# Patient Record
Sex: Male | Born: 1965 | Hispanic: No | Marital: Married | Smoking: Never smoker
Health system: Southern US, Community
[De-identification: ages and names within clinical notes are randomized; demographics above are authoritative.]

---

## 2001-05-16 ENCOUNTER — Inpatient Hospital Stay (HOSPITAL_COMMUNITY): Admission: RE | Admit: 2001-05-16 | Discharge: 2001-05-17 | Payer: Self-pay | Admitting: Otolaryngology

## 2002-04-22 ENCOUNTER — Encounter: Admission: RE | Admit: 2002-04-22 | Discharge: 2002-04-22 | Payer: Self-pay | Admitting: Family Medicine

## 2002-04-23 ENCOUNTER — Ambulatory Visit (HOSPITAL_COMMUNITY): Admission: RE | Admit: 2002-04-23 | Discharge: 2002-04-23 | Payer: Self-pay | Admitting: Family Medicine

## 2002-12-23 ENCOUNTER — Encounter: Admission: RE | Admit: 2002-12-23 | Discharge: 2002-12-23 | Payer: Self-pay | Admitting: Otolaryngology

## 2004-01-22 ENCOUNTER — Encounter: Admission: RE | Admit: 2004-01-22 | Discharge: 2004-01-22 | Payer: Self-pay | Admitting: Otolaryngology

## 2005-08-08 ENCOUNTER — Encounter: Admission: RE | Admit: 2005-08-08 | Discharge: 2005-08-08 | Payer: Self-pay | Admitting: Otolaryngology

## 2014-07-12 ENCOUNTER — Ambulatory Visit (INDEPENDENT_AMBULATORY_CARE_PROVIDER_SITE_OTHER): Payer: Self-pay | Admitting: Family Medicine

## 2014-07-12 ENCOUNTER — Ambulatory Visit (INDEPENDENT_AMBULATORY_CARE_PROVIDER_SITE_OTHER): Payer: Self-pay

## 2014-07-12 VITALS — BP 128/78 | HR 74 | Temp 97.4°F | Resp 17 | Ht 69.0 in | Wt 237.8 lb

## 2014-07-12 DIAGNOSIS — W19XXXA Unspecified fall, initial encounter: Secondary | ICD-10-CM

## 2014-07-12 DIAGNOSIS — M25511 Pain in right shoulder: Secondary | ICD-10-CM

## 2014-07-12 DIAGNOSIS — S40011A Contusion of right shoulder, initial encounter: Secondary | ICD-10-CM

## 2014-07-12 DIAGNOSIS — M47812 Spondylosis without myelopathy or radiculopathy, cervical region: Secondary | ICD-10-CM

## 2014-07-12 DIAGNOSIS — M542 Cervicalgia: Secondary | ICD-10-CM

## 2014-07-12 DIAGNOSIS — M4692 Unspecified inflammatory spondylopathy, cervical region: Secondary | ICD-10-CM

## 2014-07-12 MED ORDER — CYCLOBENZAPRINE HCL 5 MG PO TABS: ORAL_TABLET | ORAL | Status: AC

## 2014-07-12 MED ORDER — DICLOFENAC SODIUM 75 MG PO TBEC: 75.0000 mg | DELAYED_RELEASE_TABLET | Freq: Two times a day (BID) | ORAL | Status: AC

## 2014-07-12 NOTE — Progress Notes (Signed)
  Subjective:  Patient ID: Kent Lopez, male    DOB: 03/04/1965  Age: 49 y.o. MRN: [id number]  49 year old man who has a history of having a lot of pain in his neck and upper back. When he was in [place] about a month ago he received an injection and some medication for this. He's not sure what those medicines were. About a week ago yesterday the patient fell at work. He fell on his right side and injured his right shoulder and his neck got worse. He has decreased range of motion of the right arm. He felt a pop when he fell. He is not on any other regular medications. He is generally healthy man.   Objective:   No acute distress. Neck has fairly good range of motion. The neck is tender in the C-spine and paraspinous muscles down to between the scapula. His right shoulder is also tender in the shoulder girdle. There is no visible swelling. He has limited range of motion, unable to fully abduct the arm to above his head. He has 2+ pitting edema of the left ankle, with some swelling above his knee. That is an old swelling problem he has had.  [facility] reading (PRIMARY) by  Dr. Alwyn RenHopper Arthritis lower cervical area Normal shoulder.    Assessment & Plan:   Assessment: Arthritis cervical spine  Cervical pain  Shoulder strain  Plan: There are no Patient Instructions on file for this visit.   Kent Buttery, MD 07/12/2014

## 2014-07-12 NOTE — Patient Instructions (Signed)
Apply heat to neck or shoulder  Take the cyclobenzaprine 5 mg 1 or 2 pills at bedtime if needed for muscle relaxation of neck  Take the diclofenac one twice daily as needed for pain and inflammation of shoulder and neck  Do the shoulder stretching exercises as discussed  Return at anytime if worse or not improving

## 2014-07-22 ENCOUNTER — Ambulatory Visit (INDEPENDENT_AMBULATORY_CARE_PROVIDER_SITE_OTHER): Payer: Self-pay | Admitting: Family Medicine

## 2014-07-22 ENCOUNTER — Ambulatory Visit (INDEPENDENT_AMBULATORY_CARE_PROVIDER_SITE_OTHER): Payer: Self-pay

## 2014-07-22 VITALS — BP 124/78 | HR 55 | Temp 99.8°F | Resp 17 | Ht 69.5 in | Wt 237.0 lb

## 2014-07-22 DIAGNOSIS — K5900 Constipation, unspecified: Secondary | ICD-10-CM

## 2014-07-22 DIAGNOSIS — M545 Low back pain, unspecified: Secondary | ICD-10-CM

## 2014-07-22 MED ORDER — MELOXICAM 15 MG PO TABS: 7.5000 mg | ORAL_TABLET | Freq: Every day | ORAL | Status: AC

## 2014-07-22 MED ORDER — DOCUSATE SODIUM 50 MG PO CAPS: 50.0000 mg | ORAL_CAPSULE | Freq: Two times a day (BID) | ORAL | Status: AC

## 2014-07-22 NOTE — Patient Instructions (Addendum)
[phone] Please call Julieanne Cotton, Office Manager to set up an appointment Office Hours: Weekdays  [address] [place], [place] [address] (Inside the [facility])  Dolor de espalda en el adulto (Back Pain, Adult)  El dolor de cintura es frecuente. Aproximadamente 1 de cada 5 personas lo sufren.La causa rara vez pone en peligro la vida. Con frecuencia mejora luego de algn tiempo.Alrededor de la mitad de las personas que sufren un inicio sbito de dolor de cintura, se sentirn mejor luego de 2 semanas. Aproximadamente 8 de cada 10 se sentirn mejor luego de 6 semanas.  CAUSAS  Algunas causas comunes son:   Distensin de los msculos o ligamentos que sostienen la columna vertebral.  Desgaste (degeneracin) de los discos vertebrales.  Artritis.  Traumatismos directos en la espalda. DIAGNSTICO  La mayor parte de las veces, la causa directa no se conoce.Sin embargo, Chief Technology Officer puede tratarse efectivamente an cuando no se Best boy.Una de las formas ms precisas de asegurar que la causa del dolor no constituye un peligro es responder a las preguntas del mdico acerca de su salud y sus sntomas. Si el mdico necesita ms informacin, podr indicar anlisis de laboratorio o Education officer, environmental un diagnstico por imgenes (radiografas o Health visitor).Sin embargo, aunque las [employer] modificaciones, generalmente no es necesaria la [place].  INSTRUCCIONES PARA EL CUIDADO EN EL HOGAR  En algunas personas, el dolor de espalda vuelve.Como rara vez es peligroso, los pacientes pueden aprender a Interior and spatial designer.   Mantngase activo. Si permanece sentado o de pie mucho tiempo en el mismo lugar, se tensiona la espalda.  No se siente, maneje ni se quede parado en un mismo lugar por ms de 30 minutos. Realice caminatas cortas en superficies planas ni bien el dolor haya cedido. Trate de Copy tiempo que camina .  No se quede en la cama.Si hace reposo  durante ms de 1 o 2 das, puede Estate agent.  No evite los ejercicios ni el trabajo.El cuerpo est hecho para moverse.No es peligroso estar [place], aunque le duela la espalda.La espalda se curar ms rpido si contina sus actividades antes de que el dolor se vaya.  Preste atencin a su cuerpo cuando se incline y se levante. Muchas personas sienten menos molestias cuando levantan objetos si doblan las rodillas, mantienen la carga cerca del cuerpo y evitan torcerse. Generalmente, las posiciones ms cmodas son las que ejercen menos tensin en la espalda en recuperacin.  Encuentre una posicin cmoda para dormir. Utilice un colchn firme y recustese de [place]. Doble ligeramente sus rodillas. Si se recuesta sobre su espalda, coloque una almohada debajo de sus rodillas.  Tome slo medicamentos de venta libre o recetados, segn las indicaciones del mdico. Los medicamentos de venta libre para Primary school teacher y reducir Futures trader, son los que en general ms ayudan.El mdico podr prescribirle relajantes musculares.Estos medicamentos calman el dolor de modo que pueda retornar a sus actividades normales y a Investment banker, operational.  Aplique hielo sobre la zona lesionada.  Ponga el hielo en una bolsa plstica.  Colquese una toalla entre la piel y la bolsa de hielo.  Deje la bolsa de hielo durante 15 a 20 minutos 3 a 4 veces por da, durante los primeros 2  3 das. Luego podr alternar Eusebio Me calor y hielo para reducir Chief Technology Officer y los espasmos.  Consulte a su mdico si puede tratar de hacer ejercicios para la espalda y recibir un masaje suave. Pueden ser beneficiosos.  Evite sentirse ansioso o estresado.El estrs aumenta la tensin muscular y puede empeorar el dolor de espalda.Es importante reconocer cuando est ansioso o estresado y aprender la forma de controlarlos.El ejercicio es una gran opcin. SOLICITE ATENCIN MDICA SI:   Siente un dolor que no se alivia con  reposo o medicamentos.  El dolor no mejora en 1 semana.  Desarrolla nuevos sntomas.  No se siente bien en general. SOLICITE ATENCIN MDICA DE INMEDIATO SI:  Siente un dolor que se irradia desde la espalda hacia sus piernas.  Desarrolla nuevos problemas en el intestino o la vejiga.  Siente debilidad o adormecimiento inusual en sus brazos o piernas.  Presenta nuseas o vmitos.  Presenta dolor abdominal.  Se siente desfalleciente. Document Released: 01/24/2005 Document Revised: 07/26/2011 [facility] Patient Information 2015 [place], [place]. This information is not intended to replace advice given to you by your health care provider. Make sure you discuss any questions you have with your health care provider.    Estreimiento (Constipation) Estreimiento significa que una persona tiene menos de tres evacuaciones en una semana, dificultad para defecar, o que las heces son secas, duras, o ms grandes que lo normal. A medida que envejecemos el estreimiento es ms comn. Si intenta curar el estreimiento con medicamentos que producen la evacuacin de las heces (laxantes), el problema puede empeorar. El uso prolongado de laxantes puede hacer que los msculos del colon se debiliten. Una dieta baja en fibra, no tomar suficientes lquidos y el uso de ciertos medicamentos pueden Scientist, research (life sciences).  CAUSAS   Ciertos medicamentos, como los antidepresivos, analgsicos, suplementos de hierro, anticidos y diurticos.  Algunas enfermedades, como la diabetes, el sndrome del colon irritable, enfermedad de la tiroides, o depresin.  No beber suficiente agua.  No consumir suficientes alimentos ricos en fibra.  Situaciones de estrs o viajes.  Falta de actividad fsica o de ejercicio.  Ignorar la necesidad sbita de Advertising copywriter.  Uso en exceso de laxantes. SIGNOS Y SNTOMAS   Defecar menos de tres veces por semana.  Dificultad para defecar.  Tener las heces secas y duras, o ms  grandes que las normales.  Sensacin de estar lleno o hinchado.  Dolor en la parte baja del abdomen.  No sentir alivio despus de defecar. DIAGNSTICO  El mdico le har una historia clnica y un examen fsico. Pueden hacerle exmenes adicionales para el estreimiento grave. Estos estudios pueden ser:  Un radiografa con enema de bario para examinar el recto, el colon y, en algunos casos, el intestino delgado.  Una sigmoidoscopia para examinar el colon inferior.  Una colonoscopia para examinar todo el colon. TRATAMIENTO  El tratamiento depender de la gravedad del estreimiento y de la causa. Algunos tratamientos nutricionales son beber ms lquidos y comer ms alimentos ricos en fibra. El cambio en el estilo de vida incluye hacer ejercicios de [place] regular. Si estas recomendaciones para Public relations account executive dieta y en el estilo de vida no ayudan, el mdico le puede indicar el uso de laxantes de venta libre para ayudarlo a Advertising copywriter. Los medicamentos recetados se pueden prescribir si los medicamentos de venta libre no lo [place].  INSTRUCCIONES PARA EL CUIDADO EN EL HOGAR   Consuma alimentos con alto contenido de [place], como frutas, vegetales, cereales integrales y porotos.  Limite los alimentos procesados ricos en grasas y azcar, como las papas fritas, hamburguesas, galletas, dulces y refrescos.  Puede agregar un suplemento de fibra a su dieta si no obtiene lo suficiente de los alimentos.  Beba suficiente  lquido para Photographer orina clara o de color amarillo plido.  Haga ejercicio regularmente o segn las indicaciones del mdico.  Vaya al bao cuando sienta la necesidad de ir. No se aguante las ganas.  Tome solo medicamentos de venta libre o recetados, segn las indicaciones del mdico. No tome otros medicamentos para el estreimiento sin consultarlo antes con su mdico. SOLICITE ATENCIN MDICA DE INMEDIATO SI:   Observa sangre brillante en las heces.  El estreimiento  dura ms de 4 das o [place].  Siente dolor abdominal o rectal.  Las heces son delgadas como un lpiz.  Pierde peso de [place] inexplicable. ASEGRESE DE QUE:   Comprende estas instrucciones.  Controlar su afeccin.  Recibir ayuda de inmediato si no mejora o si empeora. Document Released: 02/13/2007 Document Revised: 01/29/2013 [facility] Patient Information 2015 [place], [place]. This information is not intended to replace advice given to you by your health care provider. Make sure you discuss any questions you have with your health care provider.

## 2014-07-22 NOTE — Progress Notes (Signed)
    MRN: [id number] DOB: [birth year]  Subjective:   Kent Lopez is a 49 y.o. male presenting for chief complaint of Back Pain  Reports 4 day history of worsening low back pain. Patient cannot recall inciting factor but he does work in [employer] and suffered a fall about 2 weeks ago was seen here by Dr. Alwyn Ren, started on diclofenac and Flexeril with resolution of shoulder pain and neck pain. Today he reports that his back pain started off intermittent but is now constant, achy in nature, non-radiating. Also has associated constipation, hard stools, straining. Has tried diclofenac and Flexeril with minimal relief. Denies fevers, saddle paresthesias, shooting pain, leg pain, history of arthritis, history of renal stones. Also denies dysuria, hematuria, nausea, vomiting, abdominal pain, bloody stool. Denies any other aggravating or relieving factors, no other questions or concerns.  Gilles has a current medication list which includes the following prescription(s): acetaminophen, cyclobenzaprine, and diclofenac. He has No Known Allergies.  Hensley  has no past medical history on file. Also  has no past surgical history on file.  ROS As in subjective.  Objective:   Vitals: BP 124/78 mmHg  Pulse 55  Temp(Src) 99.8 F (37.7 C) (Oral)  Resp 17  Ht 5' 9.5" (1.765 m)  Wt 237 lb (107.502 kg)  BMI 34.51 kg/m2  SpO2 93%  Physical Exam  Constitutional: He appears well-developed and well-nourished.  Body habitus is obese.  Cardiovascular: Normal rate.   Pulmonary/Chest: Effort normal.  Abdominal: Soft. Bowel sounds are normal. He exhibits no distension and no mass. There is no tenderness.  Musculoskeletal:       Lumbar back: He exhibits tenderness (over areas depicted) and spasm (paraspinal muscles). He exhibits normal range of motion (slow and winces with flexion, lateral rotation), no bony tenderness, no swelling, no edema, no deformity and no laceration.       Back:  Skin: Skin is warm and  dry. No rash noted. No erythema. No pallor.   [facility] reading (PRIMARY) by  Dr. Milus Glazier and PA-Jonathan Kirkendoll. Lumbar: no acute process noted, mild degenerative changes seen, mild spondylosis, also mild-moderate stool burden.  Assessment and Plan :   1. Bilateral low back pain without sciatica - Stable, physical exam findings and x-ray reassuring. I recommended physical therapy which patient declined. I provided patient with information to [facility], patient states that he would call in to see about how much it would cost. In meantime I provided patient with a work note for work restrictions to help with his back pain, start meloxicam and continue Flexeril at night. Patient may also use hot and cold packs, back brace at work. Return to clinic in 2 weeks if no improvement.  2. Constipation, unspecified constipation type - Recommended dietary modifications, start exercising. Also provide patient with a prescription for stool softener and instructions on how to take this. Follow-up as above.  Patient does not get regular health care. I recommended patient start considering obtaining health insurance so that he could start getting regular health checks given his current lifestyle.  Wallis Bamberg, PA-C [facility] [phone] 07/22/2014 5:37 PM   X-rays, history and physical reviewed with Mr. Urban Gibson, I agree with current assessment and plan Elvina Sidle, MD

## 2014-11-04 ENCOUNTER — Ambulatory Visit (INDEPENDENT_AMBULATORY_CARE_PROVIDER_SITE_OTHER): Payer: Self-pay | Admitting: Family Medicine

## 2014-11-04 VITALS — BP 122/88 | HR 73 | Temp 97.7°F | Resp 18 | Ht 69.5 in | Wt 236.0 lb

## 2014-11-04 DIAGNOSIS — M542 Cervicalgia: Secondary | ICD-10-CM

## 2014-11-04 DIAGNOSIS — M25511 Pain in right shoulder: Secondary | ICD-10-CM

## 2014-11-04 MED ORDER — NABUMETONE 750 MG PO TABS: 750.0000 mg | ORAL_TABLET | Freq: Every day | ORAL | Status: AC

## 2014-11-04 MED ORDER — TRIAMCINOLONE ACETONIDE 40 MG/ML IJ SUSP
20.0000 mg | Freq: Once | INTRAMUSCULAR | Status: AC
  Administered 2014-11-04: 20 mg via INTRA_ARTICULAR

## 2014-11-04 NOTE — Patient Instructions (Addendum)
Do the neck exerises as instructed in the booklet  If you keep having a lot of neck and pain into both sides of the shoulders we will refer you to physical therapy  Do loosing up exercises with the right shoulder , especially today to work the medicine around  Take the relafen (nabumetone) one pill twice daily for pain and inflammation of shoulder as needed  Return if problems persist.  We might have to refer to physical therapy or to an orthopedist  Apply ice tonight 15-20 minutes several times.

## 2014-11-04 NOTE — Progress Notes (Signed)
Patient ID: Kent Lopez, male    DOB: Dec 06, 1965  Age: 49 y.o. MRN: [id number]  Chief Complaint  Patient presents with  . Shoulder Pain    right, x 3 months/ Dr. Alwyn Ren    Subjective:   Patient continues to have problems with pain in the right shoulder. I told him last time the defect Bothering him we might need to inject it. It still bothers him just anterior to the acjoint. He has fair range of motion but it hurts if he raises it up. He cannot bring it across in front of him abducting it adequately. 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  His neck continues to hurt him in the paraspinous muscles. He has limited range of motion of the neck.  Reviewed old x-rays  Current allergies, medications, problem list, past/family and social histories reviewed.  Objective:  BP 122/88 mmHg  Pulse 73  Temp(Src) 97.7 F (36.5 C) (Oral)  Resp 18  Ht 5' 9.5" (1.765 m)  Wt 236 lb (107.049 kg)  BMI 34.36 kg/m2  SpO2 98%  Tenderness in the anterior aspect of the right shoulder just in front of the ac joint. Neck has decreased range of motion in all directions.  Assessment & Plan:   Assessment: 1. Right anterior shoulder pain   2. Cervical pain       Plan:   To go ahead and inject it.  He wanted on a different NSAID so we'll try Relafen  Procedure note: Using sterile technique the right shoulder was injected anterior aspect. The patient tolerated the procedure well. 1 mL of 2% lidocaine +1/2 mL of Kenalog 40 (20 mg was used. Patient tolerated well. He was instructed in its care.    Meds ordered this encounter  Medications  . triamcinolone acetonide (KENALOG-40) injection 20 mg    Sig:   . nabumetone (RELAFEN) 750 MG tablet    Sig: Take 1 tablet (750 mg total) by mouth daily.    Dispense:  60 tablet    Refill:  1         Patient Instructions   Do the neck exerises as instructed in the booklet  If you keep having a lot of neck and  pain into both sides of the shoulders we will refer you to physical therapy  Do loosing up exercises with the right shoulder , especially today to work the medicine around  Take the relafen (nabumetone) one pill twice daily for pain and inflammation of shoulder as needed  Return if problems persist.  We might have to refer to physical therapy or to an orthopedist  Apply ice tonight 15-20 minutes several times.    Return if symptoms worsen or fail to improve.   HOPPER,DAVID, MD 11/04/2014

## 2017-01-08 IMAGING — CR DG LUMBAR SPINE COMPLETE 4+V
5 series · 5 of 5 positions shown · non-contrast
Comparison: None.

CLINICAL DATA: Lumbago without sciatica

EXAM:
LUMBAR SPINE - COMPLETE 4+ VIEW

[AP]
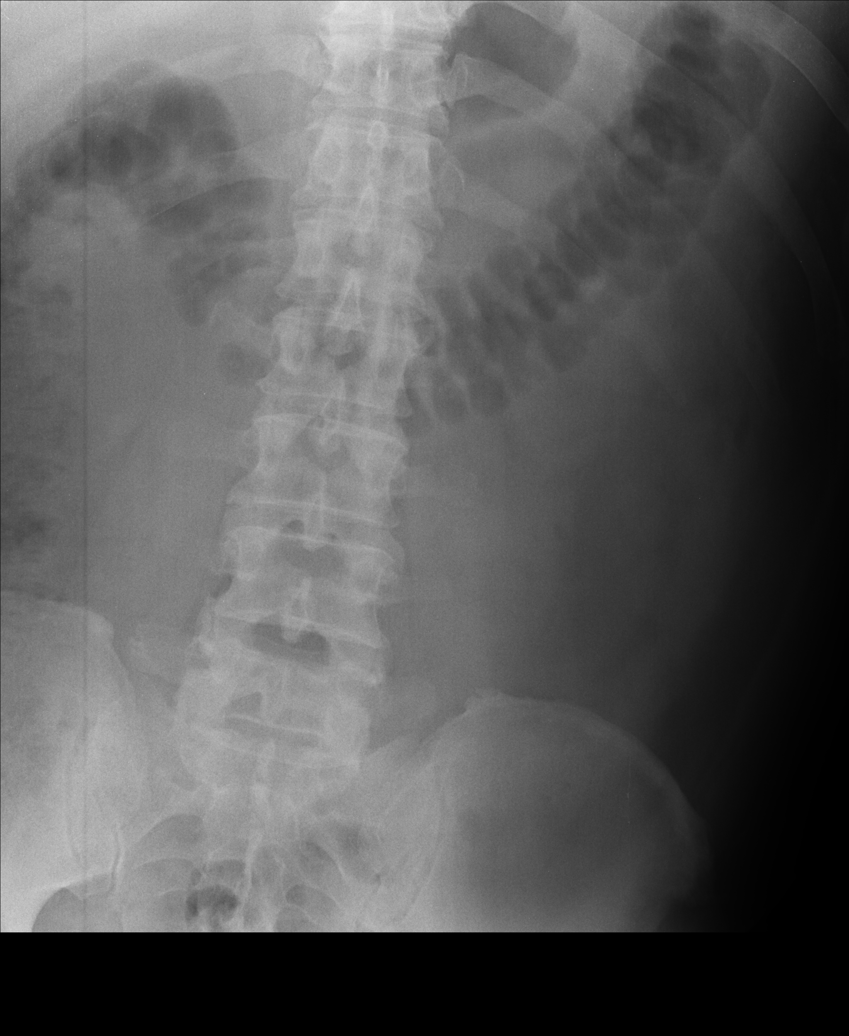

[rpo]
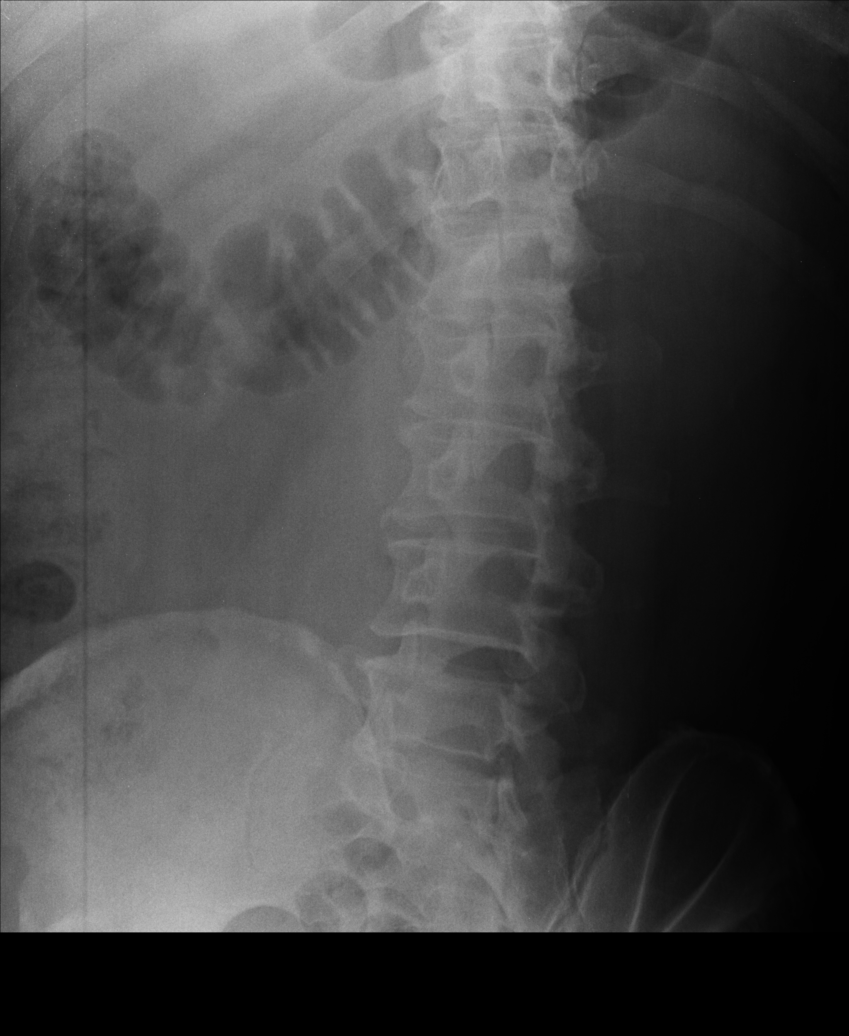

[lpo]
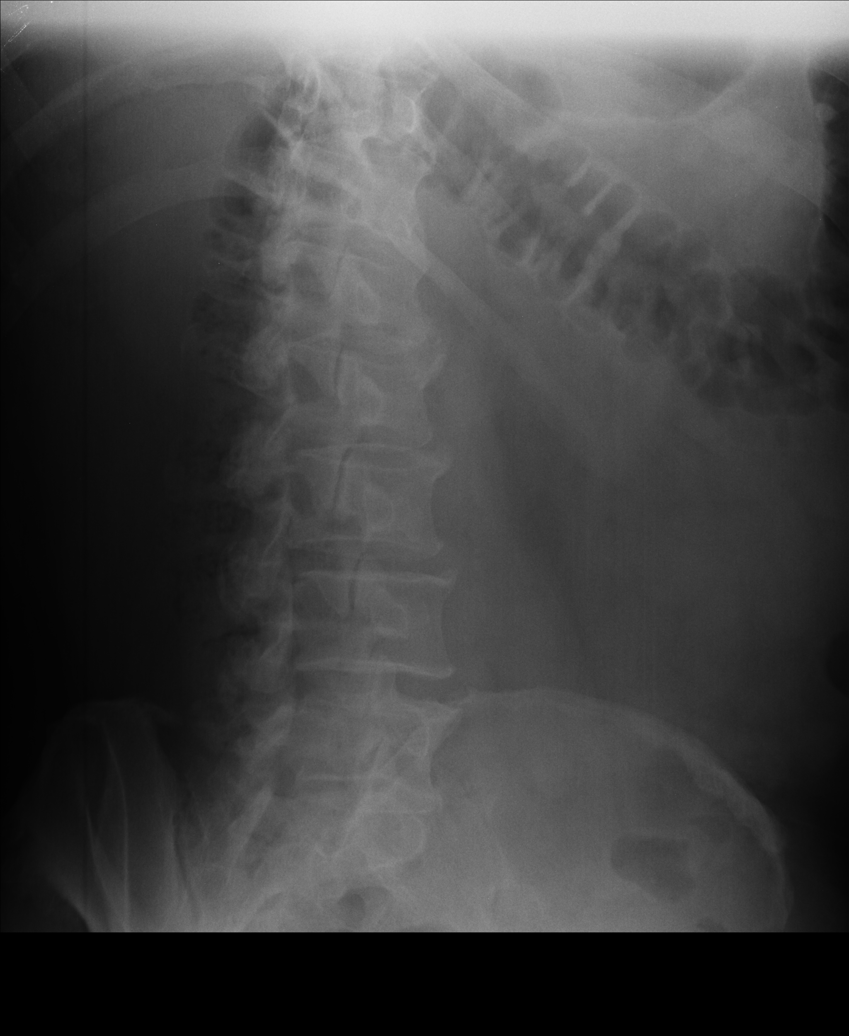

[lateral]
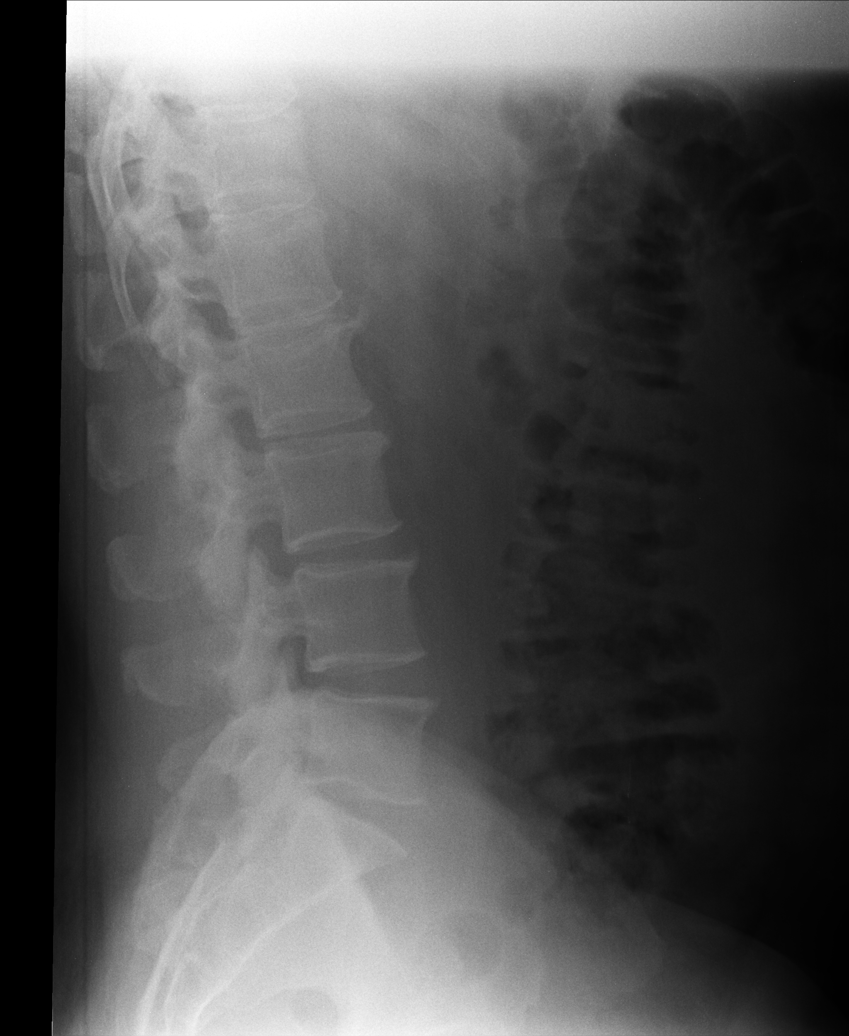

[l5 s1]
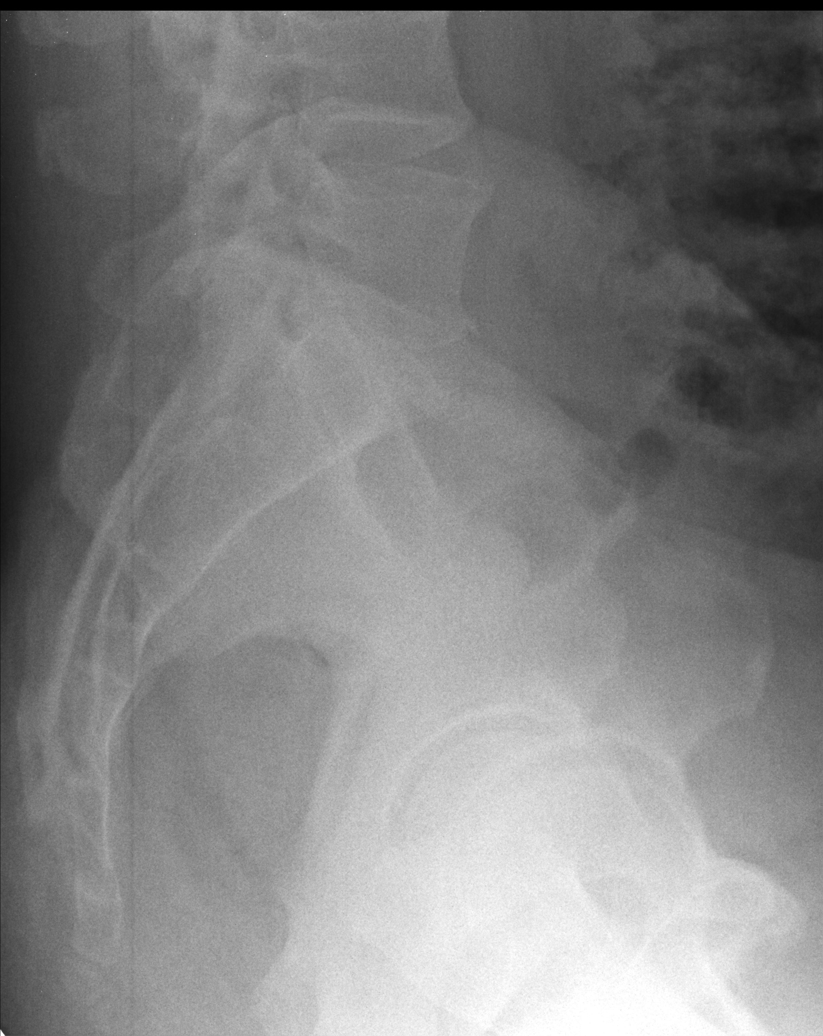

[5 of 5 positions shown; findings below may reference images not displayed]

FINDINGS: Frontal, lateral, spot lumbosacral lateral, and bilateral oblique
views were obtained. There are 5 non-rib-bearing lumbar type
vertebral bodies. There is no fracture or spondylolisthesis. There
is mild disc space narrowing at L1-2 and L2-3. There are anterior
osteophytes at all levels. There is facet osteoarthritic change at
L4-5 and L5-S1 bilaterally.
IMPRESSION: Areas of osteoarthritic change.  No fracture or spondylolisthesis.
